# Patient Record
Sex: Male | Born: 1990 | Race: White | Hispanic: No | Marital: Single | Smoking: Current every day smoker
Health system: Southern US, Community
[De-identification: ages and names within clinical notes are randomized; demographics above are authoritative.]

---
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
## 2010-07-06 ENCOUNTER — Encounter: Payer: Self-pay | Admitting: Family Medicine

## 2010-10-31 NOTE — Procedures (Signed)
HISTORY:  This is a 20 year old patient who is being evaluated for episodes  of syncope.  Patient is being evaluated for possible seizures.  This is a  routine EEG.  No skull defects are noted.   EEG CLASSIFICATION:  Normal awake.   DESCRIPTION OF RECORDING:  The background rhythm of this recording consists  of a fairly well modulated medium amplitude alpha rhythm of 10 Hz that is  reactive to eye opening and closure.  As the record progresses, patient  appears to remain in awakened state throughout the recording.  Photic  stimulation is performed and results in a bilateral and symmetric photic  driving response.  Hyperventilation is also performed resulting in a fair  buildup of background rhythm activity without significant slowing seen.  At  no times during the recording did there appear to be evidence of spike,  spike wave discharges or evidence of focal slowing.  EKG monitor shows no  evidence of cardiac rhythm abnormalities with a heart rate of 102.   IMPRESSION:  This is a normal EEG recording in awakened state.  No evidence  of ictal or interictal discharges were seen.      ZOX:WRUE  D:  08/04/2004 15:24:55  T:  08/04/2004 22:02:02  Job #:  [id number]

## 2011-04-21 ENCOUNTER — Emergency Department (INDEPENDENT_AMBULATORY_CARE_PROVIDER_SITE_OTHER): Payer: Self-pay

## 2011-04-21 ENCOUNTER — Emergency Department (HOSPITAL_BASED_OUTPATIENT_CLINIC_OR_DEPARTMENT_OTHER)
Admission: EM | Admit: 2011-04-21 | Discharge: 2011-04-21 | Disposition: A | Discharge status: Home / Self Care | Payer: Self-pay | Attending: Emergency Medicine | Admitting: Emergency Medicine

## 2011-04-21 ENCOUNTER — Other Ambulatory Visit: Payer: Self-pay

## 2011-04-21 DIAGNOSIS — K5732 Diverticulitis of large intestine without perforation or abscess without bleeding: Secondary | ICD-10-CM

## 2011-04-21 DIAGNOSIS — R1012 Left upper quadrant pain: Secondary | ICD-10-CM

## 2011-04-21 DIAGNOSIS — K5792 Diverticulitis of intestine, part unspecified, without perforation or abscess without bleeding: Secondary | ICD-10-CM

## 2011-04-21 DIAGNOSIS — R1033 Periumbilical pain: Secondary | ICD-10-CM | POA: Insufficient documentation

## 2011-04-21 DIAGNOSIS — R000 Tachycardia, unspecified: Secondary | ICD-10-CM

## 2011-04-21 DIAGNOSIS — R109 Unspecified abdominal pain: Secondary | ICD-10-CM

## 2011-04-21 DIAGNOSIS — J45909 Unspecified asthma, uncomplicated: Secondary | ICD-10-CM | POA: Insufficient documentation

## 2011-04-21 LAB — URINALYSIS, ROUTINE W REFLEX MICROSCOPIC
Bilirubin Urine: NEGATIVE
Glucose, UA: NEGATIVE mg/dL
Hgb urine dipstick: NEGATIVE
Ketones, ur: NEGATIVE mg/dL
Leukocytes, UA: NEGATIVE
Nitrite: NEGATIVE
Protein, ur: NEGATIVE mg/dL
Specific Gravity, Urine: 1.021 (ref 1.005–1.030)
Urobilinogen, UA: 0.2 mg/dL (ref 0.0–1.0)
pH: 6 (ref 5.0–8.0)

## 2011-04-21 LAB — COMPREHENSIVE METABOLIC PANEL
ALT: 18 U/L (ref 0–53)
AST: 16 U/L (ref 0–37)
Albumin: 4.3 g/dL (ref 3.5–5.2)
Alkaline Phosphatase: 71 U/L (ref 39–117)
Calcium: 9.6 mg/dL (ref 8.4–10.5)
GFR calc Af Amer: >90 mL/min (ref >90)
Glucose, Bld: 104 mg/dL — ABNORMAL HIGH (ref 70–99)
Potassium: 3.8 mEq/L (ref 3.5–5.1)
Sodium: 140 mEq/L (ref 135–145)
Total Protein: 7.8 g/dL (ref 6.0–8.3)

## 2011-04-21 LAB — CBC
HCT: 40 % (ref 39.0–52.0)
Hemoglobin: 14.1 g/dL (ref 13.0–17.0)
MCH: 31.7 pg (ref 26.0–34.0)
MCHC: 35.3 g/dL (ref 30.0–36.0)
MCV: 89.9 fL (ref 78.0–100.0)
RBC: 4.45 MIL/uL (ref 4.22–5.81)

## 2011-04-21 MED ORDER — METRONIDAZOLE 500 MG PO TABS: 500.0000 mg | ORAL_TABLET | Freq: Four times a day (QID) | ORAL | Status: AC

## 2011-04-21 MED ORDER — CIPROFLOXACIN HCL 750 MG PO TABS: 750.0000 mg | ORAL_TABLET | Freq: Two times a day (BID) | ORAL | Status: AC

## 2011-04-21 MED ORDER — ONDANSETRON HCL 4 MG/2ML IJ SOLN
4.0000 mg | Freq: Once | INTRAMUSCULAR | Status: AC
  Administered 2011-04-21: 4 mg via INTRAVENOUS
  Filled 2011-04-21: qty 2

## 2011-04-21 MED ORDER — SODIUM CHLORIDE 0.9 % IV BOLUS (SEPSIS)
1000.0000 mL | Freq: Once | INTRAVENOUS | Status: AC
  Administered 2011-04-21: 1000 mL via INTRAVENOUS

## 2011-04-21 MED ORDER — MORPHINE SULFATE 4 MG/ML IJ SOLN
4.0000 mg | Freq: Once | INTRAMUSCULAR | Status: AC
  Administered 2011-04-21: 4 mg via INTRAVENOUS
  Filled 2011-04-21: qty 1

## 2011-04-21 MED ORDER — HYDROCODONE-ACETAMINOPHEN 5-500 MG PO TABS: 1.0000 | ORAL_TABLET | Freq: Four times a day (QID) | ORAL | Status: AC | PRN

## 2011-04-21 MED ORDER — METRONIDAZOLE 500 MG PO TABS
500.0000 mg | ORAL_TABLET | Freq: Once | ORAL | Status: AC
  Administered 2011-04-21: 500 mg via ORAL
  Filled 2011-04-21: qty 1

## 2011-04-21 MED ORDER — CIPROFLOXACIN HCL 500 MG PO TABS
500.0000 mg | ORAL_TABLET | Freq: Once | ORAL | Status: AC
  Administered 2011-04-21: 500 mg via ORAL
  Filled 2011-04-21: qty 1

## 2011-04-21 NOTE — ED Provider Notes (Signed)
Date: 04/21/2011  Rate: 119  Rhythm: sinus tachycardia  QRS Axis: normal  Intervals: normal QRS:  Left atrial hypertrophy  ST/T Wave abnormalities: normal  Conduction Disutrbances:none  Narrative Interpretation:   Old EKG Reviewed: none available    Carleene Cooper III, MD 04/21/11 1513

## 2011-04-21 NOTE — ED Notes (Signed)
Pt c/o of abdominal pain that started yesterday.

## 2011-04-21 NOTE — ED Notes (Signed)
Pt states that he knows he has hx of fast HR due to he was denied to give blood x 3 in the past with reason that HR was elevated-pt also states he took one hydrocodone of his sister's PTA

## 2011-04-21 NOTE — ED Provider Notes (Signed)
History     CSN: [id number] Arrival date & time: 04/21/2011  1:50 PM   First MD Initiated Contact with Patient 04/21/11 1342      Chief Complaint  Patient presents with  . Abdominal Pain    (Consider location/radiation/quality/duration/timing/severity/associated sxs/prior treatment) HPI Comments: Pt states that he started with abdominal pain that was periumbilical yesterday:pt states that he is having luq tenderness  Patient is a 20 y.o. male presenting with abdominal pain. The history is provided by the patient. No language interpreter was used.  Abdominal Pain The primary symptoms of the illness include abdominal pain. The primary symptoms of the illness do not include fever, nausea, vomiting, diarrhea or dysuria. The current episode started yesterday. The onset of the illness was sudden. The problem has not changed since onset. Symptoms associated with the illness do not include constipation, urgency or back pain.    Past Medical History  Diagnosis Date  . Asthma     History reviewed. No pertinent past surgical history.  No family history on file.  History  Substance Use Topics  . Smoking status: Current Some Day Smoker  . Smokeless tobacco: Not on file  . Alcohol Use: No      Review of Systems  Constitutional: Negative for fever.  Gastrointestinal: Positive for abdominal pain. Negative for nausea, vomiting, diarrhea and constipation.  Genitourinary: Negative for dysuria and urgency.  Musculoskeletal: Negative for back pain.  All other systems reviewed and are negative.    Allergies  Penicillin g benzathine  Home Medications  No current outpatient prescriptions on file.  BP 146/84  Pulse 123  Temp(Src) 98.4 F (36.9 C) (Oral)  Resp 16  Ht 5\' 7"  (1.702 m)  Wt 140 lb (63.504 kg)  BMI 21.93 kg/m2  SpO2 100%  Physical Exam  Nursing note and vitals reviewed. Constitutional: He is oriented to person, place, and time. He appears well-developed and  well-nourished.  HENT:  Head: Normocephalic and atraumatic.  Eyes: EOM are normal.  Neck: Normal range of motion.  Cardiovascular: Normal rate and regular rhythm.   Pulmonary/Chest: Breath sounds normal.  Abdominal: Soft. Bowel sounds are normal.  Musculoskeletal: Normal range of motion.  Neurological: He is alert and oriented to person, place, and time.  Skin: Skin is warm and dry.  Psychiatric: He has a normal mood and affect.    ED Course  Procedures (including critical care time)  Labs Reviewed  COMPREHENSIVE METABOLIC PANEL - Abnormal; Notable for the following:    Glucose, Bld 104 (*)    All other components within normal limits  CBC - Abnormal; Notable for the following:    WBC 14.0 (*)    All other components within normal limits  URINALYSIS, ROUTINE W REFLEX MICROSCOPIC  LIPASE, BLOOD  D-DIMER, QUANTITATIVE   Ct Abdomen Pelvis Wo Contrast  04/21/2011  *RADIOLOGY REPORT*  Clinical Data: Left flank and lower quadrant pain.  CT ABDOMEN AND PELVIS WITHOUT CONTRAST  Technique:  Multidetector CT imaging of the abdomen and pelvis was performed following the standard protocol without intravenous contrast.  Comparison: None.  Findings: No evidence of renal calculi or hydronephrosis.  No evidence of ureteral calculi or dilatation.  Colonic diverticulosis is seen. Colonic wall thickening and pericolonic inflammatory changes are seen in the area of diverticulosis in the distal transverse colon, consistent with mild to moderate diverticulitis.  No evidence of abscess or extraluminal gas.  No evidence of dilated bowel loops.  No other inflammatory process or abnormal fluid collections identified.  The abdominal parenchymal organs are unremarkable in appearance on this noncontrast study.  Gallbladder is unremarkable. No soft tissue masses or lymphadenopathy identified.  IMPRESSION:  1.  Mild to moderate diverticulitis involving the distal transverse colon. 2.  No evidence of abscess or other  complication.  Original Report Authenticated By: Danae Orleans, M.D.   Dg Chest 2 View  04/21/2011  *RADIOLOGY REPORT*  Clinical Data: Left upper abdominal pain, tachycardia  CHEST - 2 VIEW  Comparison: None.  Findings: Lungs clear.  Heart size and pulmonary vascularity normal.  No effusion.  Visualized bones unremarkable.  IMPRESSION: No acute disease  Original Report Authenticated By: Osa Craver, M.D.     1. Diverticulitis       MDM  Pt tolerating po here in the er:no sign of abscess noted:will send home on antibiotics:discussed with pt and family things that would warrant return:pt states that he has a history of tachycardia, don't think that it is related to process going on today   Medical screening examination/treatment/procedure(s) were performed by non-physician practitioner and as supervising physician I was immediately available for consultation/collaboration. Osvaldo Human, M.D.      Teressa Lower, NP 04/21/11 1723  Carleene Cooper III, MD 04/22/11 2100

## 2012-06-28 IMAGING — CT CT ABD-PELV W/O CM
2 of 4 series · 16 of 46 positions shown, 18 images · non-contrast
Comparison: None.

CLINICAL DATA: Left flank and lower quadrant pain.

CT ABDOMEN AND PELVIS WITHOUT CONTRAST
TECHNIQUE: Multidetector CT imaging of the abdomen and pelvis was
performed following the standard protocol without intravenous
contrast.

[Series 2: renal stone < 200 lbs 5.0 b31f · axial · 0.65mm/px · z∈[+764,+1188]mm · 13 of 95 slices shown, 15 images]
[im 5/95  soft-tissue]
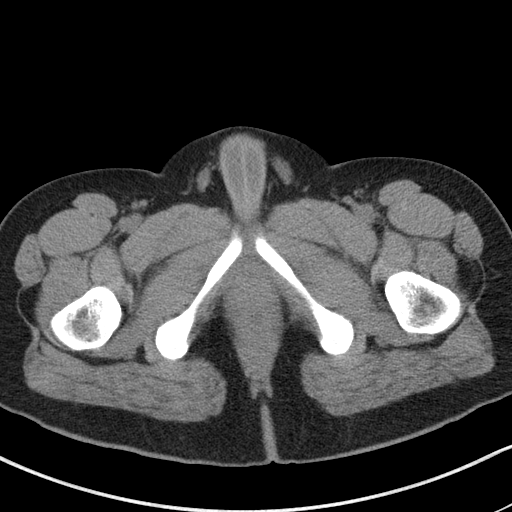
[im 5/95  bone]
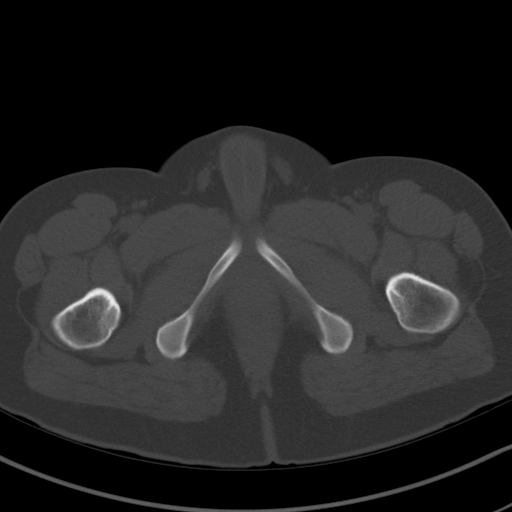
[im 13/95  soft-tissue]
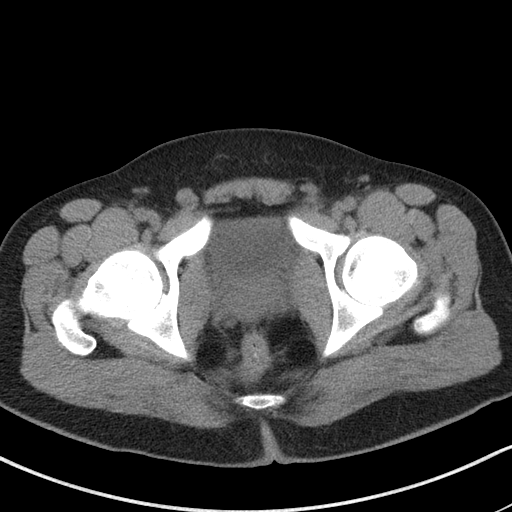
[im 21/95  soft-tissue]
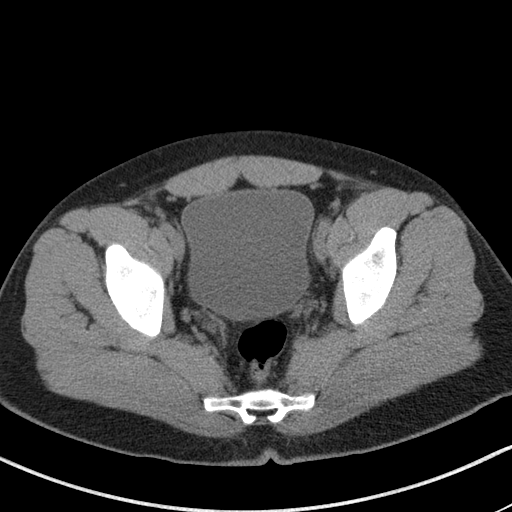
[im 25/95  soft-tissue]
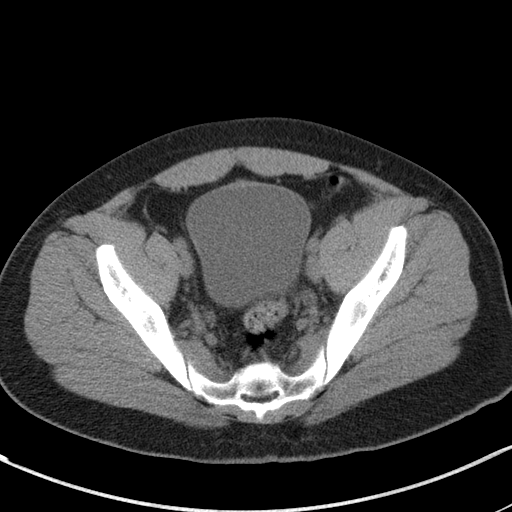
[im 33/95  soft-tissue]
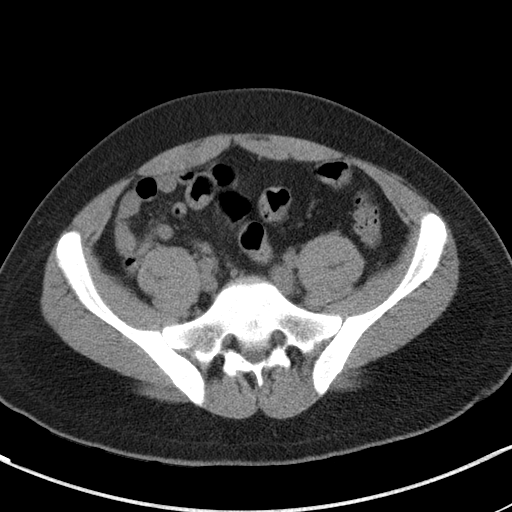
[im 41/95  soft-tissue]
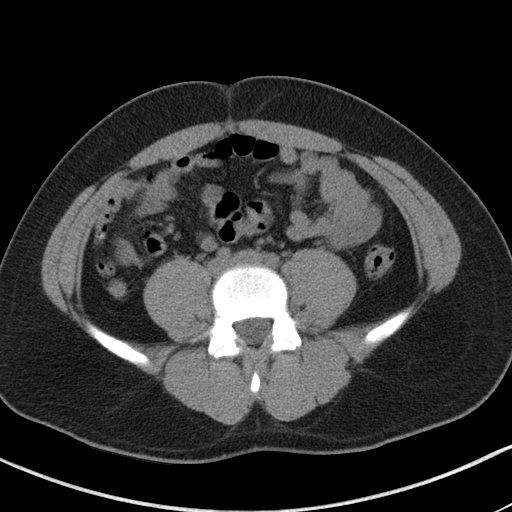
[im 50/95  soft-tissue]
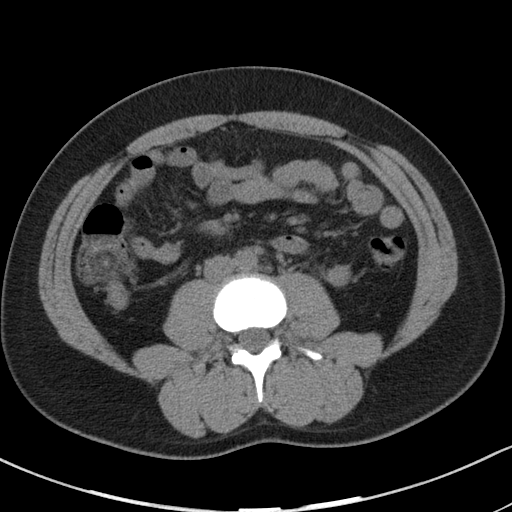
[im 54/95  soft-tissue]
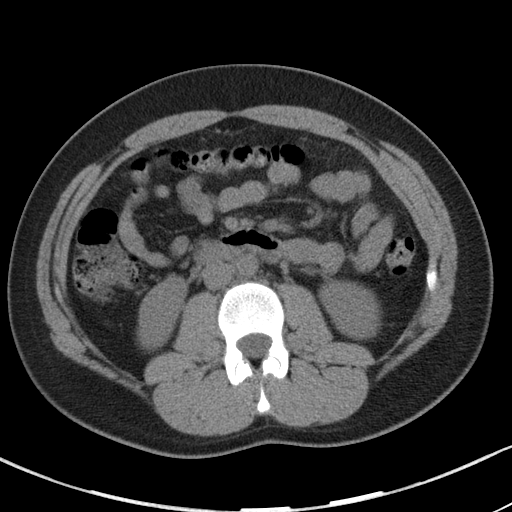
[im 62/95  soft-tissue]
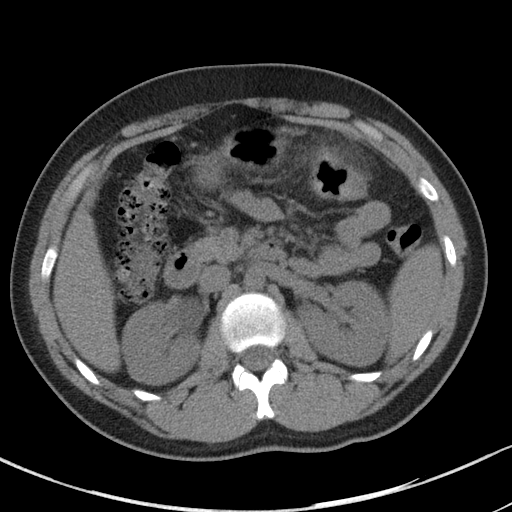
[im 62/95  bone]
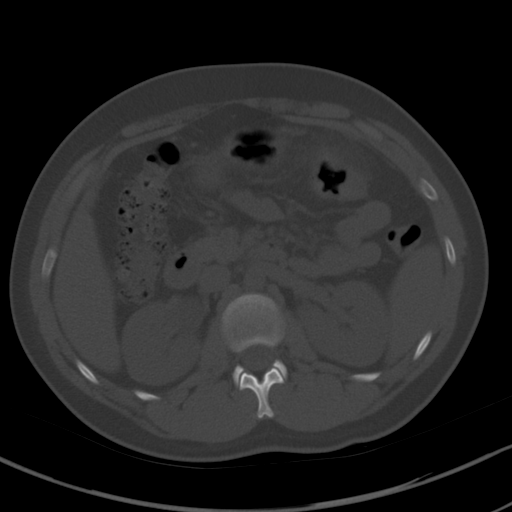
[im 70/95  soft-tissue]
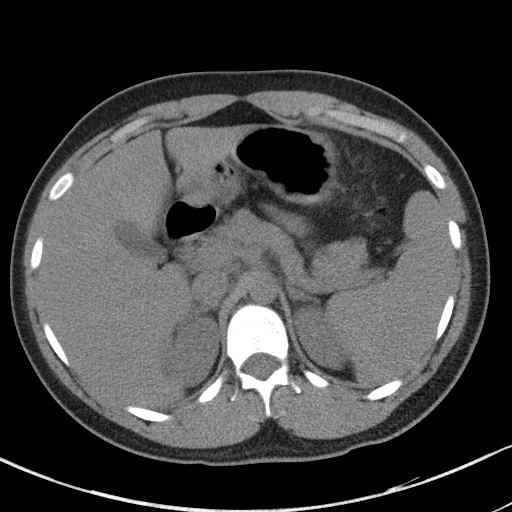
[im 74/95  soft-tissue]
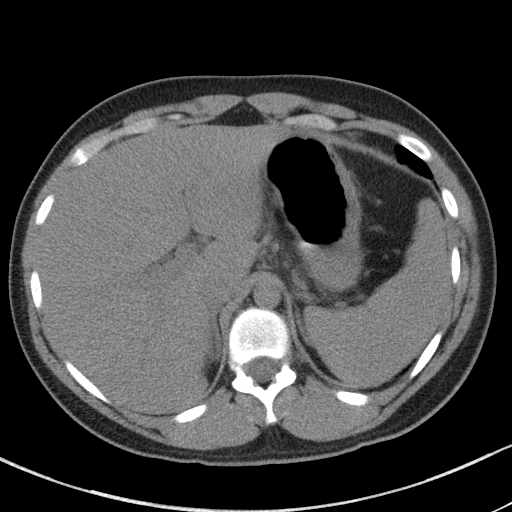
[im 82/95  soft-tissue]
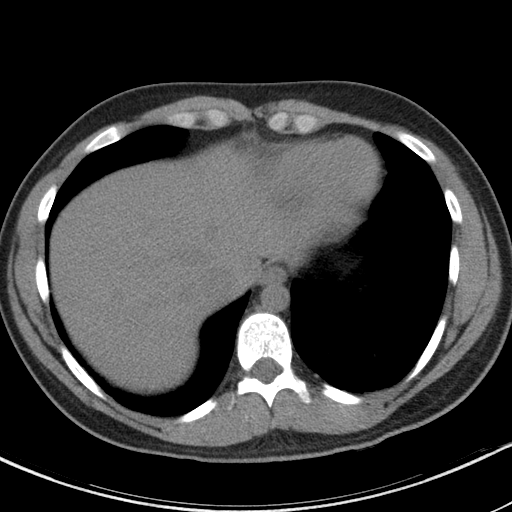
[im 90/95  soft-tissue]
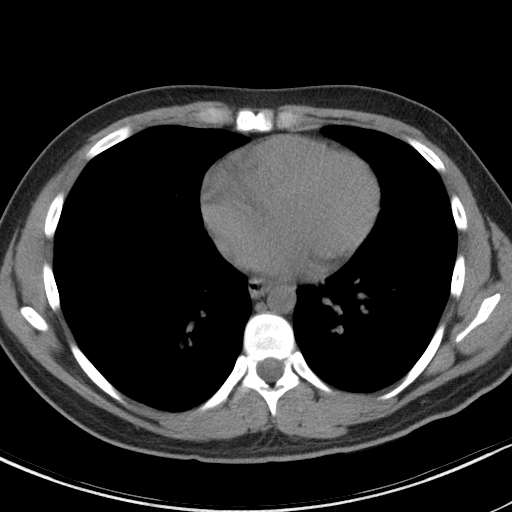

[Series 5: renal stone 3.0 coronal · coronal · 0.64mm/px · 3 of 86 slices shown]
[im 29/86  soft-tissue]
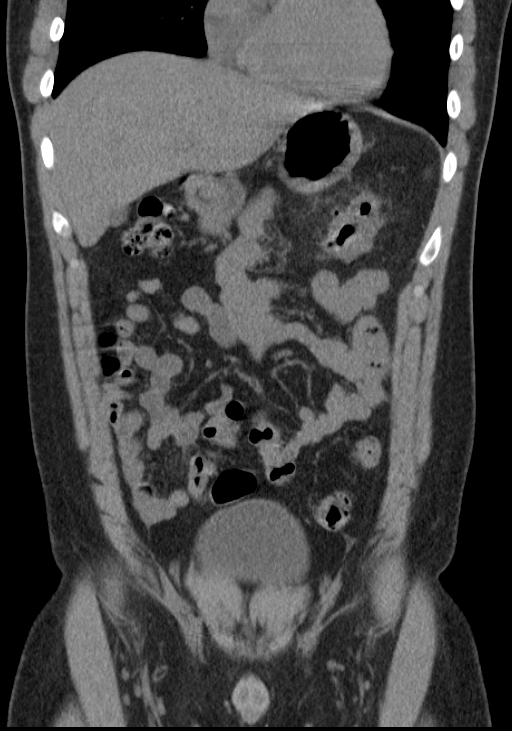
[im 38/86  soft-tissue]
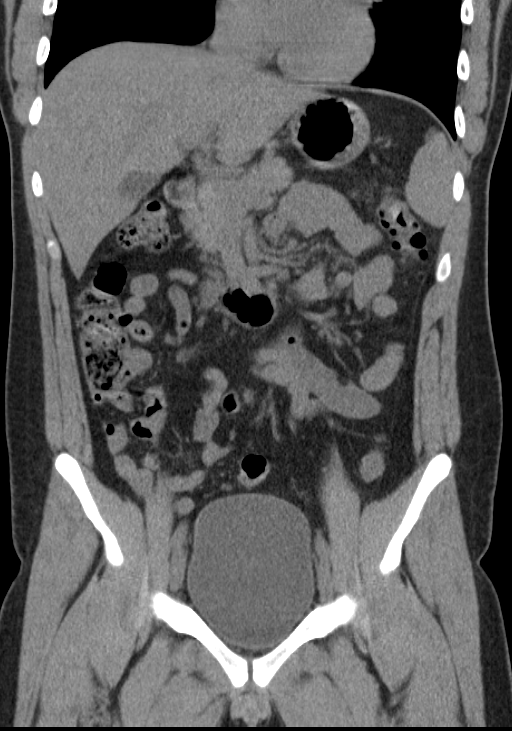
[im 48/86  soft-tissue]
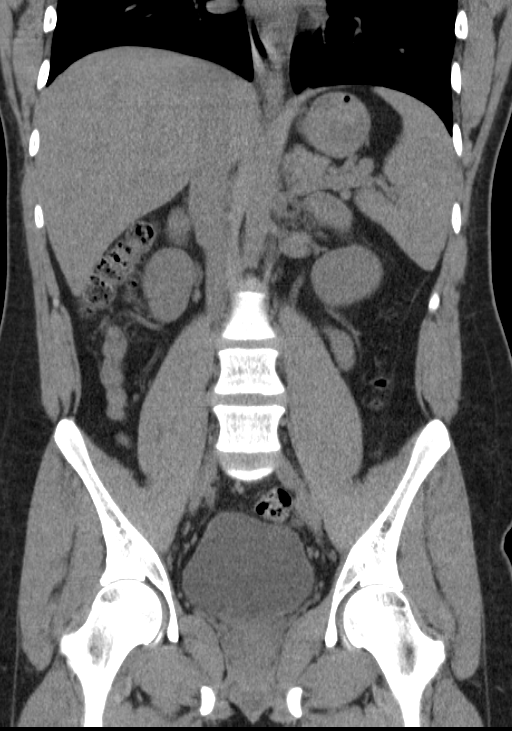

[16 of 46 positions shown; findings below may reference images not displayed]

FINDINGS: No evidence of renal calculi or hydronephrosis.  No
evidence of ureteral calculi or dilatation.

Colonic diverticulosis is seen. Colonic wall thickening and
pericolonic inflammatory changes are seen in the area of
diverticulosis in the distal transverse colon, consistent with mild
to moderate diverticulitis.  No evidence of abscess or extraluminal
gas.  No evidence of dilated bowel loops.

No other inflammatory process or abnormal fluid collections
identified.  The abdominal parenchymal organs are unremarkable in
appearance on this noncontrast study.  Gallbladder is unremarkable.
No soft tissue masses or lymphadenopathy identified.
IMPRESSION: 1.  Mild to moderate diverticulitis involving the distal transverse
colon.
2.  No evidence of abscess or other complication.

## 2020-09-01 ENCOUNTER — Emergency Department (HOSPITAL_COMMUNITY)
Admission: EM | Admit: 2020-09-01 | Discharge: 2020-09-01 | Disposition: A | Discharge status: Home / Self Care | Payer: BC Managed Care – PPO | Attending: Emergency Medicine | Admitting: Emergency Medicine

## 2020-09-01 ENCOUNTER — Encounter (HOSPITAL_COMMUNITY): Payer: Self-pay

## 2020-09-01 ENCOUNTER — Emergency Department (HOSPITAL_COMMUNITY): Payer: BC Managed Care – PPO

## 2020-09-01 ENCOUNTER — Other Ambulatory Visit: Payer: Self-pay

## 2020-09-01 DIAGNOSIS — F419 Anxiety disorder, unspecified: Secondary | ICD-10-CM | POA: Diagnosis not present

## 2020-09-01 DIAGNOSIS — R002 Palpitations: Secondary | ICD-10-CM | POA: Diagnosis present

## 2020-09-01 DIAGNOSIS — J45909 Unspecified asthma, uncomplicated: Secondary | ICD-10-CM | POA: Insufficient documentation

## 2020-09-01 DIAGNOSIS — F17200 Nicotine dependence, unspecified, uncomplicated: Secondary | ICD-10-CM | POA: Diagnosis not present

## 2020-09-01 DIAGNOSIS — F10129 Alcohol abuse with intoxication, unspecified: Secondary | ICD-10-CM | POA: Diagnosis not present

## 2020-09-01 DIAGNOSIS — F10920 Alcohol use, unspecified with intoxication, uncomplicated: Secondary | ICD-10-CM

## 2020-09-01 LAB — COMPREHENSIVE METABOLIC PANEL
ALT: 24 U/L (ref 0–44)
AST: 20 U/L (ref 15–41)
Albumin: 4.7 g/dL (ref 3.5–5.0)
Alkaline Phosphatase: 47 U/L (ref 38–126)
Anion gap: 11 (ref 5–15)
BUN: 12 mg/dL (ref 6–20)
CO2: 23 mmol/L (ref 22–32)
Calcium: 9.1 mg/dL (ref 8.9–10.3)
Chloride: 111 mmol/L (ref 98–111)
Creatinine, Ser: 0.55 mg/dL — ABNORMAL LOW (ref 0.61–1.24)
GFR, Estimated: >60 mL/min (ref >60)
Glucose, Bld: 116 mg/dL — ABNORMAL HIGH (ref 70–99)
Potassium: 3.7 mmol/L (ref 3.5–5.1)
Sodium: 145 mmol/L (ref 135–145)
Total Bilirubin: 0.2 mg/dL — ABNORMAL LOW (ref 0.3–1.2)
Total Protein: 8 g/dL (ref 6.5–8.1)

## 2020-09-01 LAB — RAPID URINE DRUG SCREEN, HOSP PERFORMED
Amphetamines: NOT DETECTED
Barbiturates: NOT DETECTED
Benzodiazepines: NOT DETECTED
Cocaine: POSITIVE — AB
Opiates: NOT DETECTED
Tetrahydrocannabinol: POSITIVE — AB

## 2020-09-01 LAB — CBC WITH DIFFERENTIAL/PLATELET
Abs Immature Granulocytes: 0.03 10*3/uL (ref 0.00–0.07)
Basophils Absolute: 0 10*3/uL (ref 0.0–0.1)
Basophils Relative: 0 %
Eosinophils Absolute: 0.2 10*3/uL (ref 0.0–0.5)
Eosinophils Relative: 3 %
HCT: 44.9 % (ref 39.0–52.0)
Hemoglobin: 15.8 g/dL (ref 13.0–17.0)
Immature Granulocytes: 0 %
Lymphocytes Relative: 30 %
Lymphs Abs: 2.1 10*3/uL (ref 0.7–4.0)
MCH: 33.9 pg (ref 26.0–34.0)
MCHC: 35.2 g/dL (ref 30.0–36.0)
MCV: 96.4 fL (ref 80.0–100.0)
Monocytes Absolute: 0.5 10*3/uL (ref 0.1–1.0)
Monocytes Relative: 7 %
Neutro Abs: 4.4 10*3/uL (ref 1.7–7.7)
Neutrophils Relative %: 60 %
Platelets: 215 10*3/uL (ref 150–400)
RBC: 4.66 MIL/uL (ref 4.22–5.81)
RDW: 12.7 % (ref 11.5–15.5)
WBC: 7.3 10*3/uL (ref 4.0–10.5)
nRBC: 0 % (ref 0.0–0.2)

## 2020-09-01 LAB — TROPONIN I (HIGH SENSITIVITY): Troponin I (High Sensitivity): 4 ng/L (ref <18)

## 2020-09-01 LAB — TSH: TSH: 2.331 u[IU]/mL (ref 0.350–4.500)

## 2020-09-01 LAB — ETHANOL: Alcohol, Ethyl (B): 286 mg/dL — ABNORMAL HIGH (ref <10)

## 2020-09-01 MED ORDER — HYDROXYZINE HCL 25 MG PO TABS: 25.0000 mg | ORAL_TABLET | Freq: Three times a day (TID) | ORAL | 0 refills | Status: AC | PRN

## 2020-09-01 MED ORDER — ALBUTEROL SULFATE HFA 108 (90 BASE) MCG/ACT IN AERS
2.0000 | INHALATION_SPRAY | RESPIRATORY_TRACT | Status: DC | PRN
  Administered 2020-09-01: 2 via RESPIRATORY_TRACT
  Filled 2020-09-01: qty 6.7

## 2020-09-01 MED ORDER — AEROCHAMBER Z-STAT PLUS/MEDIUM MISC
1.0000 | Freq: Once | Status: AC
  Administered 2020-09-01: 1
  Filled 2020-09-01: qty 1

## 2020-09-01 NOTE — ED Provider Notes (Signed)
[facility] Provider Note   CSN: [id number] Arrival date & time: 09/01/20  0701     History No chief complaint on file.   Bryan Cameron is a 30 y.o. male.  30 year old male with past medical history including asthma who presents with palpitations and chest pain.  EMS reports the patient was drinking alcohol and smoking a cigarette and had palpitations so he called EMS.  He told them that has been getting worse over the past several years.  He tells me that it has been going on for several months and he has palpitations at least once a day.  He has occasional episodes of chest pressure that often happen when the palpitations occur but sometimes outside of the palpitations episodes.  Patient told me that he does not drink alcohol daily but admitted to EMS that he has 3-15 shots of liquor every day.  He denies other drug use.  No fevers or vomiting.  No history of heart disease in parents or siblings but history of heart problems in grandparents.  The history is provided by the patient and the EMS personnel.       Past Medical History:  Diagnosis Date  . Asthma     Patient Active Problem List   Diagnosis Date Noted  . ACUTE BRONCHITIS 02/22/2008  . ACUTE PHARYNGITIS 11/28/2007    History reviewed. No pertinent surgical history.     History reviewed. No pertinent family history.  Social History   Tobacco Use  . Smoking status: Current Some Day Smoker  Substance Use Topics  . Alcohol use: No  . Drug use: No    Home Medications Prior to Admission medications   Medication Sig Start Date End Date Taking? Authorizing Provider  hydrOXYzine (ATARAX/VISTARIL) 25 MG tablet Take 1 tablet (25 mg total) by mouth every 8 (eight) hours as needed for anxiety. 09/01/20  Yes Gavyn Zoss, Ambrose Finland, MD    Allergies    Penicillin g benzathine  Review of Systems   Review of Systems All other systems reviewed and are negative except that which was  mentioned in HPI  Physical Exam Updated Vital Signs BP 126/84   Pulse (!) 114   Temp 98.2 F (36.8 C) (Oral)   Resp 19   Ht 5\' 7"  (1.702 m)   Wt 63.5 kg   SpO2 96%   BMI 21.93 kg/m   Physical Exam Vitals and nursing note reviewed.  Constitutional:      General: He is not in acute distress.    Appearance: Normal appearance.  HENT:     Head: Normocephalic and atraumatic.  Eyes:     Comments: B/l conjunctival injection, dilated pupils  Cardiovascular:     Rate and Rhythm: Regular rhythm. Tachycardia present.     Heart sounds: Normal heart sounds. No murmur heard.   Pulmonary:     Effort: Pulmonary effort is normal.     Breath sounds: Normal breath sounds.  Abdominal:     General: Abdomen is flat. Bowel sounds are normal. There is no distension.     Palpations: Abdomen is soft.     Tenderness: There is no abdominal tenderness.  Musculoskeletal:     Right lower leg: No edema.     Left lower leg: No edema.  Skin:    General: Skin is warm and dry.  Neurological:     Mental Status: He is alert and oriented to person, place, and time.     Comments: fluent  Psychiatric:  Mood and Affect: Affect is flat.        Speech: Speech is delayed.        Behavior: Behavior is cooperative.        Cognition and Memory: Cognition is impaired.     ED Results / Procedures / Treatments   Labs (all labs ordered are listed, but only abnormal results are displayed) Labs Reviewed  COMPREHENSIVE METABOLIC PANEL - Abnormal; Notable for the following components:      Result Value   Glucose, Bld 116 (*)    Creatinine, Ser 0.55 (*)    Total Bilirubin 0.2 (*)    All other components within normal limits  RAPID URINE DRUG SCREEN, HOSP PERFORMED - Abnormal; Notable for the following components:   Cocaine POSITIVE (*)    Tetrahydrocannabinol POSITIVE (*)    All other components within normal limits  ETHANOL - Abnormal; Notable for the following components:   Alcohol, Ethyl (B) 286  (*)    All other components within normal limits  CBC WITH DIFFERENTIAL/PLATELET  TSH  TROPONIN I (HIGH SENSITIVITY)    EKG EKG Interpretation  Date/Time:  Sunday September 01 2020 07:28:25 EDT Ventricular Rate:  113 PR Interval:  144 QRS Duration: 84 QT Interval:  316 QTC Calculation: 433 R Axis:   -21 Text Interpretation: Sinus tachycardia Otherwise normal ECG No significant change since last tracing Confirmed by Frederick Peers [phone]) on 09/01/2020 8:04:22 AM   Radiology DG Chest 2 View  Result Date: 09/01/2020 CLINICAL DATA:  30 year old male with palpitations, left side chest pain and shortness of breath. EXAM: CHEST - 2 VIEW COMPARISON:  Chest radiographs 04/21/2011. FINDINGS: Mildly lower lung volumes. Mediastinal contours remain normal. Visualized tracheal air column is within normal limits. Both lungs remain clear. No pneumothorax or pleural effusion. Negative visible bowel gas and osseous structures. IMPRESSION: Negative.  No cardiopulmonary abnormality. Electronically Signed   By: Odessa Fleming M.D.   On: 09/01/2020 08:10    Procedures Procedures   Medications Ordered in ED Medications  albuterol (VENTOLIN HFA) 108 (90 Base) MCG/ACT inhaler 2 puff (2 puffs Inhalation Given 09/01/20 0844)  aerochamber Z-Stat Plus/medium 1 each (1 each Other Given 09/01/20 8786)    ED Course  I have reviewed the triage vital signs and the nursing notes.  Pertinent labs & imaging results that were available during my care of the patient were reviewed by me and considered in my medical decision making (see chart for details).    MDM Rules/Calculators/A&P                          PT mildly tachycardic on exam, appears mildly intoxicated. EKG without ischemic changes.  BAL 286, UDS positive for cocaine and THC. CBC And CMP reassuring, trop negative, TSH normal.  Patient's main complaint on reassessment is feeling anxious. Sx not suggestive of ACS.  I counseled patient on alcohol and drug cessation  and encouraged to follow-up with PCP regarding his symptoms.  Provided with Atarax to use as needed at home but explained that this is only a temporary solution, but PCP can discuss long-term anxiety management.  Patient requested refill on albuterol inhaler. Final Clinical Impression(s) / ED Diagnoses Final diagnoses:  Palpitations  Anxiety  Alcoholic intoxication without complication (HCC)    Rx / DC Orders ED Discharge Orders         Ordered    hydrOXYzine (ATARAX/VISTARIL) 25 MG tablet  Every 8 hours PRN  09/01/20 0908           Ariany Kesselman, Ambrose Finland, MD 09/01/20 [phone]

## 2020-09-01 NOTE — ED Triage Notes (Signed)
Patient BIB [facility] from home. Patient was drinking and smoking a cigarette while he was having palpitations, so he called EMS. Patient said its been getting worse over the past few years. He has history of anxiety, but does not take meds for it. 3-15 shots of Liquor every day.

## 2023-04-01 ENCOUNTER — Other Ambulatory Visit (HOSPITAL_BASED_OUTPATIENT_CLINIC_OR_DEPARTMENT_OTHER): Payer: Self-pay

## 2023-04-01 MED ORDER — LISDEXAMFETAMINE DIMESYLATE 30 MG PO CAPS
30.0000 mg | ORAL_CAPSULE | Freq: Every morning | ORAL | 0 refills | Status: AC
  Filled 2023-04-01: qty 30, 30d supply, fill #0

## 2023-04-28 ENCOUNTER — Other Ambulatory Visit (HOSPITAL_BASED_OUTPATIENT_CLINIC_OR_DEPARTMENT_OTHER): Payer: Self-pay

## 2023-04-28 ENCOUNTER — Other Ambulatory Visit: Payer: Self-pay

## 2023-04-28 MED ORDER — LISDEXAMFETAMINE DIMESYLATE 40 MG PO CAPS
40.0000 mg | ORAL_CAPSULE | Freq: Every morning | ORAL | 0 refills | Status: AC
  Filled 2023-04-28 – 2023-04-29 (×2): qty 30, 30d supply, fill #0

## 2023-04-29 ENCOUNTER — Other Ambulatory Visit (HOSPITAL_BASED_OUTPATIENT_CLINIC_OR_DEPARTMENT_OTHER): Payer: Self-pay

## 2023-05-25 ENCOUNTER — Other Ambulatory Visit (HOSPITAL_BASED_OUTPATIENT_CLINIC_OR_DEPARTMENT_OTHER): Payer: Self-pay

## 2023-05-25 MED ORDER — LISDEXAMFETAMINE DIMESYLATE 50 MG PO CAPS
50.0000 mg | ORAL_CAPSULE | Freq: Every morning | ORAL | 0 refills | Status: AC
  Filled 2023-05-25 (×2): qty 30, 30d supply, fill #0

## 2023-06-28 ENCOUNTER — Other Ambulatory Visit (HOSPITAL_BASED_OUTPATIENT_CLINIC_OR_DEPARTMENT_OTHER): Payer: Self-pay

## 2023-06-29 ENCOUNTER — Other Ambulatory Visit (HOSPITAL_BASED_OUTPATIENT_CLINIC_OR_DEPARTMENT_OTHER): Payer: Self-pay

## 2023-06-30 ENCOUNTER — Other Ambulatory Visit: Payer: Self-pay

## 2023-06-30 ENCOUNTER — Other Ambulatory Visit (HOSPITAL_BASED_OUTPATIENT_CLINIC_OR_DEPARTMENT_OTHER): Payer: Self-pay

## 2023-06-30 MED ORDER — AMLODIPINE BESYLATE 10 MG PO TABS
10.0000 mg | ORAL_TABLET | Freq: Every day | ORAL | 0 refills | Status: AC
  Filled 2023-06-30: qty 90, 90d supply, fill #0

## 2023-06-30 MED ORDER — PANTOPRAZOLE SODIUM 20 MG PO TBEC
20.0000 mg | DELAYED_RELEASE_TABLET | Freq: Every morning | ORAL | 1 refills | Status: AC
  Filled 2023-06-30 (×2): qty 90, 90d supply, fill #0

## 2023-06-30 MED ORDER — TEMAZEPAM 15 MG PO CAPS
15.0000 mg | ORAL_CAPSULE | Freq: Every evening | ORAL | 0 refills | Status: AC
  Filled 2023-06-30 (×2): qty 30, 30d supply, fill #0

## 2023-06-30 MED ORDER — LISDEXAMFETAMINE DIMESYLATE 60 MG PO CAPS
60.0000 mg | ORAL_CAPSULE | Freq: Every morning | ORAL | 0 refills | Status: AC
  Filled 2023-06-30 (×2): qty 30, 30d supply, fill #0

## 2023-07-01 ENCOUNTER — Other Ambulatory Visit: Payer: Self-pay

## 2023-07-05 ENCOUNTER — Other Ambulatory Visit (HOSPITAL_BASED_OUTPATIENT_CLINIC_OR_DEPARTMENT_OTHER): Payer: Self-pay

## 2023-08-09 ENCOUNTER — Other Ambulatory Visit (HOSPITAL_BASED_OUTPATIENT_CLINIC_OR_DEPARTMENT_OTHER): Payer: Self-pay

## 2023-08-09 MED ORDER — AMPHETAMINE-DEXTROAMPHETAMINE 20 MG PO TABS
20.0000 mg | ORAL_TABLET | Freq: Two times a day (BID) | ORAL | 0 refills | Status: DC
  Filled 2023-08-09: qty 60, 30d supply, fill #0

## 2023-08-09 MED ORDER — AMLODIPINE BESYLATE 10 MG PO TABS
10.0000 mg | ORAL_TABLET | Freq: Every day | ORAL | 0 refills | Status: AC
  Filled 2023-08-09: qty 90, 90d supply, fill #0

## 2023-08-09 MED ORDER — TEMAZEPAM 15 MG PO CAPS
15.0000 mg | ORAL_CAPSULE | Freq: Every evening | ORAL | 0 refills | Status: AC | PRN
  Filled 2023-08-09: qty 30, 30d supply, fill #0

## 2023-09-06 ENCOUNTER — Other Ambulatory Visit (HOSPITAL_BASED_OUTPATIENT_CLINIC_OR_DEPARTMENT_OTHER): Payer: Self-pay

## 2023-09-06 MED ORDER — AMPHETAMINE-DEXTROAMPHETAMINE 20 MG PO TABS
20.0000 mg | ORAL_TABLET | Freq: Two times a day (BID) | ORAL | 0 refills | Status: DC
  Filled 2023-09-06: qty 60, 30d supply, fill #0

## 2023-10-04 ENCOUNTER — Other Ambulatory Visit (HOSPITAL_BASED_OUTPATIENT_CLINIC_OR_DEPARTMENT_OTHER): Payer: Self-pay

## 2023-10-04 MED ORDER — AMPHETAMINE-DEXTROAMPHETAMINE 20 MG PO TABS
20.0000 mg | ORAL_TABLET | Freq: Two times a day (BID) | ORAL | 0 refills | Status: DC
  Filled 2023-10-04: qty 60, 30d supply, fill #0

## 2023-10-05 ENCOUNTER — Other Ambulatory Visit (HOSPITAL_BASED_OUTPATIENT_CLINIC_OR_DEPARTMENT_OTHER): Payer: Self-pay

## 2023-10-08 ENCOUNTER — Other Ambulatory Visit (HOSPITAL_BASED_OUTPATIENT_CLINIC_OR_DEPARTMENT_OTHER): Payer: Self-pay

## 2023-10-08 MED ORDER — AMLODIPINE BESYLATE 10 MG PO TABS
10.0000 mg | ORAL_TABLET | Freq: Every day | ORAL | 0 refills | Status: AC
  Filled 2023-10-08 – 2023-10-11 (×2): qty 90, 90d supply, fill #0

## 2023-10-11 ENCOUNTER — Other Ambulatory Visit: Payer: Self-pay

## 2023-10-11 ENCOUNTER — Other Ambulatory Visit (HOSPITAL_BASED_OUTPATIENT_CLINIC_OR_DEPARTMENT_OTHER): Payer: Self-pay

## 2023-11-02 ENCOUNTER — Other Ambulatory Visit (HOSPITAL_COMMUNITY): Payer: Self-pay

## 2023-11-02 ENCOUNTER — Other Ambulatory Visit (HOSPITAL_BASED_OUTPATIENT_CLINIC_OR_DEPARTMENT_OTHER): Payer: Self-pay

## 2023-11-02 MED ORDER — AMPHETAMINE-DEXTROAMPHETAMINE 20 MG PO TABS
20.0000 mg | ORAL_TABLET | Freq: Two times a day (BID) | ORAL | 0 refills | Status: DC
  Filled 2023-11-02 (×2): qty 60, 30d supply, fill #0

## 2023-12-06 ENCOUNTER — Other Ambulatory Visit (HOSPITAL_BASED_OUTPATIENT_CLINIC_OR_DEPARTMENT_OTHER): Payer: Self-pay

## 2023-12-06 MED ORDER — AMPHETAMINE-DEXTROAMPHETAMINE 20 MG PO TABS
20.0000 mg | ORAL_TABLET | Freq: Two times a day (BID) | ORAL | 0 refills | Status: AC
  Filled 2023-12-06: qty 60, 30d supply, fill #0

## 2023-12-16 ENCOUNTER — Other Ambulatory Visit (HOSPITAL_BASED_OUTPATIENT_CLINIC_OR_DEPARTMENT_OTHER): Payer: Self-pay

## 2023-12-16 MED ORDER — BUPROPION HCL ER (XL) 300 MG PO TB24
300.0000 mg | ORAL_TABLET | Freq: Every morning | ORAL | 1 refills | Status: AC
  Filled 2023-12-16: qty 30, 30d supply, fill #0

## 2023-12-16 MED ORDER — METHYLPREDNISOLONE 4 MG PO TBPK
ORAL_TABLET | ORAL | 0 refills | Status: DC
  Filled 2023-12-16: qty 21, 6d supply, fill #0

## 2023-12-16 MED ORDER — TEMAZEPAM 15 MG PO CAPS
15.0000 mg | ORAL_CAPSULE | Freq: Every day | ORAL | 0 refills | Status: AC
  Filled 2023-12-16: qty 30, 30d supply, fill #0

## 2023-12-16 MED ORDER — DOXYCYCLINE HYCLATE 100 MG PO CAPS
100.0000 mg | ORAL_CAPSULE | Freq: Two times a day (BID) | ORAL | 0 refills | Status: AC
  Filled 2023-12-16: qty 20, 10d supply, fill #0

## 2024-01-11 ENCOUNTER — Other Ambulatory Visit (HOSPITAL_BASED_OUTPATIENT_CLINIC_OR_DEPARTMENT_OTHER): Payer: Self-pay

## 2024-01-11 ENCOUNTER — Other Ambulatory Visit: Payer: Self-pay

## 2024-01-11 MED ORDER — AMPHETAMINE-DEXTROAMPHETAMINE 30 MG PO TABS
30.0000 mg | ORAL_TABLET | Freq: Two times a day (BID) | ORAL | 0 refills | Status: DC
  Filled 2024-01-11: qty 60, 30d supply, fill #0

## 2024-01-11 MED ORDER — DOXEPIN HCL 6 MG PO TABS
6.0000 mg | ORAL_TABLET | Freq: Every day | ORAL | 0 refills | Status: AC
  Filled 2024-01-11: qty 30, 30d supply, fill #0

## 2024-01-11 MED ORDER — AMLODIPINE BESYLATE 10 MG PO TABS
10.0000 mg | ORAL_TABLET | Freq: Every day | ORAL | 0 refills | Status: DC
  Filled 2024-01-11: qty 90, 90d supply, fill #0

## 2024-01-12 ENCOUNTER — Other Ambulatory Visit: Payer: Self-pay

## 2024-02-01 ENCOUNTER — Other Ambulatory Visit (HOSPITAL_BASED_OUTPATIENT_CLINIC_OR_DEPARTMENT_OTHER): Payer: Self-pay

## 2024-02-01 MED ORDER — CYCLOBENZAPRINE HCL 10 MG PO TABS
10.0000 mg | ORAL_TABLET | Freq: Three times a day (TID) | ORAL | 0 refills | Status: AC
  Filled 2024-02-01: qty 30, 10d supply, fill #0

## 2024-02-01 MED ORDER — DOXYCYCLINE HYCLATE 100 MG PO CAPS
100.0000 mg | ORAL_CAPSULE | Freq: Two times a day (BID) | ORAL | 0 refills | Status: AC
  Filled 2024-02-01: qty 20, 10d supply, fill #0

## 2024-02-01 MED ORDER — METHYLPREDNISOLONE 4 MG PO TBPK
ORAL_TABLET | ORAL | 0 refills | Status: AC
  Filled 2024-02-01: qty 21, 6d supply, fill #0

## 2024-02-15 ENCOUNTER — Other Ambulatory Visit (HOSPITAL_BASED_OUTPATIENT_CLINIC_OR_DEPARTMENT_OTHER): Payer: Self-pay

## 2024-02-15 MED ORDER — AMPHETAMINE-DEXTROAMPHETAMINE 30 MG PO TABS
30.0000 mg | ORAL_TABLET | Freq: Two times a day (BID) | ORAL | 0 refills | Status: DC
  Filled 2024-02-15: qty 60, 30d supply, fill #0

## 2024-03-13 ENCOUNTER — Encounter (HOSPITAL_COMMUNITY): Payer: Self-pay

## 2024-03-13 ENCOUNTER — Inpatient Hospital Stay (HOSPITAL_COMMUNITY): Payer: Self-pay

## 2024-03-13 ENCOUNTER — Emergency Department (HOSPITAL_COMMUNITY): Payer: Self-pay

## 2024-03-13 ENCOUNTER — Other Ambulatory Visit: Payer: Self-pay

## 2024-03-13 ENCOUNTER — Inpatient Hospital Stay (HOSPITAL_COMMUNITY)
Admission: EM | Admit: 2024-03-13 | Discharge: 2024-03-15 | DRG: 082 | Disposition: A | Discharge status: Home / Self Care | Payer: Self-pay | Source: Intra-hospital | Attending: Surgery | Admitting: Surgery

## 2024-03-13 DIAGNOSIS — S0232XA Fracture of orbital floor, left side, initial encounter for closed fracture: Secondary | ICD-10-CM

## 2024-03-13 DIAGNOSIS — S020XXA Fracture of vault of skull, initial encounter for closed fracture: Secondary | ICD-10-CM

## 2024-03-13 DIAGNOSIS — S0240DA Maxillary fracture, left side, initial encounter for closed fracture: Secondary | ICD-10-CM

## 2024-03-13 DIAGNOSIS — S0240FA Zygomatic fracture, left side, initial encounter for closed fracture: Secondary | ICD-10-CM

## 2024-03-13 DIAGNOSIS — S064XAA Epidural hemorrhage with loss of consciousness status unknown, initial encounter: Secondary | ICD-10-CM | POA: Diagnosis present

## 2024-03-13 DIAGNOSIS — S01112A Laceration without foreign body of left eyelid and periocular area, initial encounter: Secondary | ICD-10-CM

## 2024-03-13 DIAGNOSIS — S066XAA Traumatic subarachnoid hemorrhage with loss of consciousness status unknown, initial encounter: Secondary | ICD-10-CM | POA: Diagnosis present

## 2024-03-13 DIAGNOSIS — Z79899 Other long term (current) drug therapy: Secondary | ICD-10-CM

## 2024-03-13 DIAGNOSIS — S0219XA Other fracture of base of skull, initial encounter for closed fracture: Secondary | ICD-10-CM | POA: Diagnosis present

## 2024-03-13 DIAGNOSIS — H9190 Unspecified hearing loss, unspecified ear: Secondary | ICD-10-CM | POA: Diagnosis present

## 2024-03-13 DIAGNOSIS — R1310 Dysphagia, unspecified: Secondary | ICD-10-CM | POA: Diagnosis present

## 2024-03-13 DIAGNOSIS — W2111XA Struck by baseball bat, initial encounter: Secondary | ICD-10-CM

## 2024-03-13 DIAGNOSIS — R040 Epistaxis: Secondary | ICD-10-CM | POA: Diagnosis present

## 2024-03-13 DIAGNOSIS — Y907 Blood alcohol level of 200-239 mg/100 ml: Secondary | ICD-10-CM | POA: Diagnosis present

## 2024-03-13 DIAGNOSIS — M264 Malocclusion, unspecified: Secondary | ICD-10-CM | POA: Diagnosis present

## 2024-03-13 DIAGNOSIS — R40241 Glasgow coma scale score 13-15, unspecified time: Secondary | ICD-10-CM | POA: Diagnosis present

## 2024-03-13 DIAGNOSIS — Z880 Allergy status to penicillin: Secondary | ICD-10-CM

## 2024-03-13 DIAGNOSIS — F1010 Alcohol abuse, uncomplicated: Secondary | ICD-10-CM | POA: Diagnosis present

## 2024-03-13 DIAGNOSIS — S06A0XA Traumatic brain compression without herniation, initial encounter: Secondary | ICD-10-CM | POA: Diagnosis present

## 2024-03-13 DIAGNOSIS — S065XAA Traumatic subdural hemorrhage with loss of consciousness status unknown, initial encounter: Secondary | ICD-10-CM | POA: Diagnosis present

## 2024-03-13 DIAGNOSIS — Z23 Encounter for immunization: Secondary | ICD-10-CM

## 2024-03-13 LAB — URINALYSIS, ROUTINE W REFLEX MICROSCOPIC
Bilirubin Urine: NEGATIVE
Glucose, UA: 50 mg/dL — AB
Hgb urine dipstick: NEGATIVE
Ketones, ur: NEGATIVE mg/dL
Leukocytes,Ua: NEGATIVE
Nitrite: NEGATIVE
Protein, ur: NEGATIVE mg/dL
Specific Gravity, Urine: 1.018 (ref 1.005–1.030)
pH: 5 (ref 5.0–8.0)

## 2024-03-13 LAB — BASIC METABOLIC PANEL WITH GFR
Anion gap: 16 — ABNORMAL HIGH (ref 5–15)
BUN: 10 mg/dL (ref 6–20)
CO2: 21 mmol/L — ABNORMAL LOW (ref 22–32)
Calcium: 8.1 mg/dL — ABNORMAL LOW (ref 8.9–10.3)
Chloride: 103 mmol/L (ref 98–111)
Creatinine, Ser: 0.63 mg/dL (ref 0.61–1.24)
GFR, Estimated: >60 mL/min (ref >60)
Glucose, Bld: 147 mg/dL — ABNORMAL HIGH (ref 70–99)
Potassium: 3.1 mmol/L — ABNORMAL LOW (ref 3.5–5.1)
Sodium: 140 mmol/L (ref 135–145)

## 2024-03-13 LAB — CBC
HCT: 37.8 % — ABNORMAL LOW (ref 39.0–52.0)
HCT: 38.9 % — ABNORMAL LOW (ref 39.0–52.0)
Hemoglobin: 13.2 g/dL (ref 13.0–17.0)
Hemoglobin: 14.1 g/dL (ref 13.0–17.0)
MCH: 33.9 pg (ref 26.0–34.0)
MCH: 34.7 pg — ABNORMAL HIGH (ref 26.0–34.0)
MCHC: 34.9 g/dL (ref 30.0–36.0)
MCHC: 36.2 g/dL — ABNORMAL HIGH (ref 30.0–36.0)
MCV: 97.2 fL (ref 80.0–100.0)
MCV: 95.8 fL (ref 80.0–100.0)
Platelets: 155 K/uL (ref 150–400)
Platelets: 180 K/uL (ref 150–400)
RBC: 3.89 MIL/uL — ABNORMAL LOW (ref 4.22–5.81)
RBC: 4.06 MIL/uL — ABNORMAL LOW (ref 4.22–5.81)
RDW: 12.1 % (ref 11.5–15.5)
RDW: 12.1 % (ref 11.5–15.5)
WBC: 7.1 K/uL (ref 4.0–10.5)
WBC: 12.3 K/uL — ABNORMAL HIGH (ref 4.0–10.5)
nRBC: 0 % (ref 0.0–0.2)
nRBC: 0 % (ref 0.0–0.2)

## 2024-03-13 LAB — SAMPLE TO BLOOD BANK

## 2024-03-13 LAB — COMPREHENSIVE METABOLIC PANEL WITH GFR
ALT: 85 U/L — ABNORMAL HIGH (ref 0–44)
AST: 78 U/L — ABNORMAL HIGH (ref 15–41)
Albumin: 3.3 g/dL — ABNORMAL LOW (ref 3.5–5.0)
Alkaline Phosphatase: 58 U/L (ref 38–126)
Anion gap: 14 (ref 5–15)
BUN: 13 mg/dL (ref 6–20)
CO2: 19 mmol/L — ABNORMAL LOW (ref 22–32)
Calcium: 7.8 mg/dL — ABNORMAL LOW (ref 8.9–10.3)
Chloride: 106 mmol/L (ref 98–111)
Creatinine, Ser: 0.71 mg/dL (ref 0.61–1.24)
GFR, Estimated: >60 mL/min (ref >60)
Glucose, Bld: 160 mg/dL — ABNORMAL HIGH (ref 70–99)
Potassium: 3 mmol/L — ABNORMAL LOW (ref 3.5–5.1)
Sodium: 139 mmol/L (ref 135–145)
Total Bilirubin: 0.4 mg/dL (ref 0.0–1.2)
Total Protein: 6.7 g/dL (ref 6.5–8.1)

## 2024-03-13 LAB — PROTIME-INR
INR: 1 (ref 0.8–1.2)
Prothrombin Time: 13.5 s (ref 11.4–15.2)

## 2024-03-13 LAB — I-STAT CHEM 8, ED
BUN: 12 mg/dL (ref 6–20)
Calcium, Ion: 0.96 mmol/L — ABNORMAL LOW (ref 1.15–1.40)
Chloride: 108 mmol/L (ref 98–111)
Creatinine, Ser: 0.9 mg/dL (ref 0.61–1.24)
Glucose, Bld: 160 mg/dL — ABNORMAL HIGH (ref 70–99)
HCT: 37 % — ABNORMAL LOW (ref 39.0–52.0)
Hemoglobin: 12.6 g/dL — ABNORMAL LOW (ref 13.0–17.0)
Potassium: 3.1 mmol/L — ABNORMAL LOW (ref 3.5–5.1)
Sodium: 142 mmol/L (ref 135–145)
TCO2: 19 mmol/L — ABNORMAL LOW (ref 22–32)

## 2024-03-13 LAB — ETHANOL: Alcohol, Ethyl (B): 211 mg/dL — ABNORMAL HIGH (ref <15)

## 2024-03-13 LAB — I-STAT CG4 LACTIC ACID, ED: Lactic Acid, Venous: 2.5 mmol/L (ref 0.5–1.9)

## 2024-03-13 LAB — HIV ANTIBODY (ROUTINE TESTING W REFLEX): HIV Screen 4th Generation wRfx: NONREACTIVE

## 2024-03-13 MED ORDER — DOCUSATE SODIUM 100 MG PO CAPS
100.0000 mg | ORAL_CAPSULE | Freq: Two times a day (BID) | ORAL | Status: DC
Start: 2024-03-13 — End: 2024-03-15
  Administered 2024-03-13 – 2024-03-15 (×9): 100 mg via ORAL
  Filled 2024-03-13 (×5): qty 1

## 2024-03-13 MED ORDER — METOPROLOL TARTRATE 5 MG/5ML IV SOLN: 5.0000 mg | Freq: Four times a day (QID) | INTRAVENOUS | Status: DC | PRN

## 2024-03-13 MED ORDER — PHENOBARBITAL 32.4 MG PO TABS
64.8000 mg | ORAL_TABLET | Freq: Three times a day (TID) | ORAL | Status: AC
  Administered 2024-03-13 – 2024-03-15 (×11): 64.8 mg via ORAL
  Filled 2024-03-13 (×6): qty 2

## 2024-03-13 MED ORDER — PHENOBARBITAL 32.4 MG PO TABS
32.4000 mg | ORAL_TABLET | Freq: Three times a day (TID) | ORAL | Status: DC
  Administered 2024-03-15: 32.4 mg via ORAL
  Filled 2024-03-13: qty 1

## 2024-03-13 MED ORDER — CEFAZOLIN SODIUM-DEXTROSE 2-4 GM/100ML-% IV SOLN
2.0000 g | Freq: Once | INTRAVENOUS | Status: AC
  Administered 2024-03-13 (×2): 2 g via INTRAVENOUS
  Filled 2024-03-13: qty 100

## 2024-03-13 MED ORDER — POLYETHYLENE GLYCOL 3350 17 G PO PACK: 17.0000 g | PACK | Freq: Every day | ORAL | Status: DC | PRN

## 2024-03-13 MED ORDER — HYDROMORPHONE HCL 1 MG/ML IJ SOLN
0.5000 mg | INTRAMUSCULAR | Status: DC | PRN
  Administered 2024-03-13 – 2024-03-14 (×8): 0.5 mg via INTRAVENOUS
  Filled 2024-03-13 (×4): qty 1

## 2024-03-13 MED ORDER — LEVETIRACETAM 500 MG PO TABS: 500.0000 mg | ORAL_TABLET | Freq: Two times a day (BID) | ORAL | Status: DC

## 2024-03-13 MED ORDER — IOHEXOL 350 MG/ML SOLN
75.0000 mL | Freq: Once | INTRAVENOUS | Status: AC | PRN
  Administered 2024-03-13 (×2): 75 mL via INTRAVENOUS

## 2024-03-13 MED ORDER — METHOCARBAMOL 500 MG PO TABS
500.0000 mg | ORAL_TABLET | Freq: Three times a day (TID) | ORAL | Status: DC
  Administered 2024-03-13 – 2024-03-15 (×13): 500 mg via ORAL
  Filled 2024-03-13 (×7): qty 1

## 2024-03-13 MED ORDER — IOHEXOL 350 MG/ML SOLN: 75.0000 mL | Freq: Once | INTRAVENOUS | Status: DC | PRN

## 2024-03-13 MED ORDER — ONDANSETRON 4 MG PO TBDP: 4.0000 mg | ORAL_TABLET | Freq: Four times a day (QID) | ORAL | Status: DC | PRN

## 2024-03-13 MED ORDER — LACTATED RINGERS IV SOLN: INTRAVENOUS | Status: DC

## 2024-03-13 MED ORDER — ONDANSETRON HCL 4 MG/2ML IJ SOLN: 4.0000 mg | Freq: Four times a day (QID) | INTRAMUSCULAR | Status: DC | PRN

## 2024-03-13 MED ORDER — OXYCODONE HCL 5 MG PO TABS
5.0000 mg | ORAL_TABLET | ORAL | Status: DC | PRN
  Administered 2024-03-13 – 2024-03-14 (×6): 5 mg via ORAL
  Filled 2024-03-13 (×4): qty 1

## 2024-03-13 MED ORDER — POTASSIUM CHLORIDE CRYS ER 20 MEQ PO TBCR
40.0000 meq | EXTENDED_RELEASE_TABLET | ORAL | Status: AC
  Administered 2024-03-13 (×4): 40 meq via ORAL
  Filled 2024-03-13 (×2): qty 2

## 2024-03-13 MED ORDER — TETANUS-DIPHTH-ACELL PERTUSSIS 5-2.5-18.5 LF-MCG/0.5 IM SUSY
0.5000 mL | PREFILLED_SYRINGE | Freq: Once | INTRAMUSCULAR | Status: AC
  Administered 2024-03-13 (×2): 0.5 mL via INTRAMUSCULAR
  Filled 2024-03-13: qty 0.5

## 2024-03-13 MED ORDER — METHOCARBAMOL 1000 MG/10ML IJ SOLN: 500.0000 mg | Freq: Three times a day (TID) | INTRAMUSCULAR | Status: DC

## 2024-03-13 MED ORDER — ACETAMINOPHEN 500 MG PO TABS
1000.0000 mg | ORAL_TABLET | Freq: Four times a day (QID) | ORAL | Status: DC
  Administered 2024-03-13 – 2024-03-15 (×16): 1000 mg via ORAL
  Filled 2024-03-13 (×9): qty 2

## 2024-03-13 MED ORDER — LORAZEPAM 2 MG/ML IJ SOLN
1.0000 mg | INTRAMUSCULAR | Status: DC | PRN
Start: 2024-03-13 — End: 2024-03-15

## 2024-03-13 MED ORDER — CHLORHEXIDINE GLUCONATE CLOTH 2 % EX PADS
6.0000 | MEDICATED_PAD | Freq: Every day | CUTANEOUS | Status: DC
  Administered 2024-03-14 – 2024-03-15 (×3): 6 via TOPICAL

## 2024-03-13 MED ORDER — NICOTINE 14 MG/24HR TD PT24
14.0000 mg | MEDICATED_PATCH | Freq: Every day | TRANSDERMAL | Status: DC
  Administered 2024-03-13 – 2024-03-15 (×5): 14 mg via TRANSDERMAL
  Filled 2024-03-13 (×3): qty 1

## 2024-03-13 MED ORDER — TETRACAINE HCL 0.5 % OP SOLN
1.0000 [drp] | Freq: Once | OPHTHALMIC | Status: AC
  Administered 2024-03-13 (×2): 1 [drp] via OPHTHALMIC

## 2024-03-13 MED ORDER — OXYCODONE HCL 5 MG PO TABS
10.0000 mg | ORAL_TABLET | ORAL | Status: DC | PRN
  Administered 2024-03-13 – 2024-03-15 (×11): 10 mg via ORAL
  Filled 2024-03-13 (×6): qty 2

## 2024-03-13 MED ORDER — CALCIUM GLUCONATE-NACL 2-0.675 GM/100ML-% IV SOLN
2.0000 g | Freq: Once | INTRAVENOUS | Status: AC
  Administered 2024-03-13 (×2): 2000 mg via INTRAVENOUS
  Filled 2024-03-13 (×2): qty 100

## 2024-03-13 MED ORDER — LEVETIRACETAM 500 MG PO TABS
500.0000 mg | ORAL_TABLET | Freq: Two times a day (BID) | ORAL | Status: DC
  Administered 2024-03-13 – 2024-03-15 (×9): 500 mg via ORAL
  Filled 2024-03-13 (×5): qty 1

## 2024-03-13 MED ORDER — LIDOCAINE HCL (PF) 1 % IJ SOLN
5.0000 mL | Freq: Once | INTRAMUSCULAR | Status: AC
  Administered 2024-03-13 (×2): 5 mL
  Filled 2024-03-13: qty 5

## 2024-03-13 MED ORDER — HYDRALAZINE HCL 20 MG/ML IJ SOLN: 10.0000 mg | INTRAMUSCULAR | Status: DC | PRN
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
## 2024-03-13 NOTE — ED Notes (Signed)
Neurosurgery
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
## 2024-03-13 NOTE — Progress Notes (Signed)
 Orthopedic Tech Progress Note Patient Details:  Bryan Cameron [facility] [birth year] [id number]  Patient ID: Bryan Cameron, male   DOB: [birth year], 33 y.o.   MRN: [id number] I attended trauma page. [name] 03/13/2024, 2:51 AM
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
## 2024-03-13 NOTE — Progress Notes (Signed)
 Pt arrive to [facility]-ICU.  Pt A&Ox4.  Left eye edematous and ecchymotic.  Pt unsure if family aware of admission to hospital.  Pt declined family notification.  Pt educated about [facility] ICU visitation policy and education provided on pt safety.  Pt declined all visitors at this time.
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
## 2024-03-14 ENCOUNTER — Encounter (HOSPITAL_COMMUNITY): Payer: Self-pay

## 2024-03-14 MED ORDER — ORAL CARE MOUTH RINSE: 15.0000 mL | OROMUCOSAL | Status: DC | PRN
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
## 2024-03-15 ENCOUNTER — Encounter (HOSPITAL_COMMUNITY): Payer: Self-pay

## 2024-03-15 ENCOUNTER — Other Ambulatory Visit (HOSPITAL_COMMUNITY): Payer: Self-pay

## 2024-03-15 LAB — MAGNESIUM: Magnesium: 1.9 mg/dL (ref 1.7–2.4)

## 2024-03-15 LAB — BASIC METABOLIC PANEL WITH GFR
Anion gap: 8 (ref 5–15)
BUN: 9 mg/dL (ref 6–20)
CO2: 25 mmol/L (ref 22–32)
Calcium: 8.8 mg/dL — ABNORMAL LOW (ref 8.9–10.3)
Chloride: 102 mmol/L (ref 98–111)
Creatinine, Ser: 0.62 mg/dL (ref 0.61–1.24)
GFR, Estimated: >60 mL/min (ref >60)
Glucose, Bld: 122 mg/dL — ABNORMAL HIGH (ref 70–99)
Potassium: 3.8 mmol/L (ref 3.5–5.1)
Sodium: 135 mmol/L (ref 135–145)

## 2024-03-15 LAB — PHOSPHORUS: Phosphorus: 2.9 mg/dL (ref 2.5–4.6)

## 2024-03-15 MED ORDER — MAGNESIUM SULFATE 2 GM/50ML IV SOLN
2.0000 g | Freq: Once | INTRAVENOUS | Status: AC
  Administered 2024-03-15: 2 g via INTRAVENOUS
  Filled 2024-03-15: qty 50

## 2024-03-15 MED ORDER — OXYCODONE HCL 5 MG PO TABS
5.0000 mg | ORAL_TABLET | ORAL | 0 refills | Status: AC | PRN
  Filled 2024-03-15: qty 10, 2d supply, fill #0

## 2024-03-15 MED ORDER — LEVETIRACETAM 500 MG PO TABS
500.0000 mg | ORAL_TABLET | Freq: Two times a day (BID) | ORAL | 0 refills | Status: AC
  Filled 2024-03-15: qty 10, 5d supply, fill #0

## 2024-03-15 MED ORDER — POTASSIUM CHLORIDE CRYS ER 20 MEQ PO TBCR
40.0000 meq | EXTENDED_RELEASE_TABLET | Freq: Once | ORAL | Status: AC
  Administered 2024-03-15: 40 meq via ORAL
  Filled 2024-03-15: qty 2

## 2024-03-15 MED ORDER — ACETAMINOPHEN 500 MG PO TABS: 1000.0000 mg | ORAL_TABLET | Freq: Four times a day (QID) | ORAL | Status: AC | PRN
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
## 2024-03-15 NOTE — Progress Notes (Signed)
 Pt being d/c.  PIVs removed Discharge information given.  Pt verb understanding and had no further questions  Transport called to wheel chair pt to d/c lounge while he waits for family to come and pick him up.

## 2024-03-15 NOTE — TOC Transition Note (Incomplete)
 Transition of Care [facility]) - Discharge Note   Patient Details  Name: Bryan Cameron MRN: [id number] Date of Birth: 18-Jun-1990  Transition of Care [facility]) CM/SW Contact:  Ruhee Enck M, RN Phone Number: 03/15/2024, 11:40 AM   Clinical Narrative:       Final next level of care: OP Rehab Barriers to Discharge: Barriers Resolved   Patient Goals and CMS Choice            Discharge Placement                       Discharge Plan and Services Additional resources added to the After Visit Summary for     Discharge Planning Services: CM Consult                                 Social Drivers of Health (SDOH) Interventions SDOH Screenings   Tobacco Use: High Risk (03/14/2024)     Readmission Risk Interventions     No data to display

## 2024-03-15 NOTE — Progress Notes (Signed)
 Pt called mother to have him p/u. Sts mother will be here in a bout 2 hours or so.
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
## 2024-03-15 NOTE — Progress Notes (Signed)
 Pharmacy Electrolyte Replacement  Recent Labs:  Recent Labs    03/15/24 0320  K 3.8  MG 1.9  PHOS 2.9  CREATININE 0.62    Low Critical Values (K </= 2.5, Phos </= 1, Mg </= 1) Present: None  MD Contacted: n/a  Plan:  KCl 40 mEq po x1 and Magnesium 2g IV x1.  Recheck in AM.  Harlene Boga, PharmD, BCPS, BCCCP Clinical Pharmacist Please refer to [facility] for [facility] numbers 03/15/2024, 9:05 AM

## 2024-03-21 ENCOUNTER — Other Ambulatory Visit (HOSPITAL_BASED_OUTPATIENT_CLINIC_OR_DEPARTMENT_OTHER): Payer: Self-pay

## 2024-03-24 ENCOUNTER — Other Ambulatory Visit (HOSPITAL_BASED_OUTPATIENT_CLINIC_OR_DEPARTMENT_OTHER): Payer: Self-pay

## 2024-03-24 MED ORDER — AMPHETAMINE-DEXTROAMPHETAMINE 30 MG PO TABS
1.0000 | ORAL_TABLET | Freq: Two times a day (BID) | ORAL | 0 refills | Status: DC
  Filled 2024-03-24: qty 60, 30d supply, fill #0

## 2024-04-26 ENCOUNTER — Other Ambulatory Visit (HOSPITAL_BASED_OUTPATIENT_CLINIC_OR_DEPARTMENT_OTHER): Payer: Self-pay

## 2024-04-27 ENCOUNTER — Other Ambulatory Visit (HOSPITAL_BASED_OUTPATIENT_CLINIC_OR_DEPARTMENT_OTHER): Payer: Self-pay

## 2024-04-27 MED ORDER — AMPHETAMINE-DEXTROAMPHETAMINE 30 MG PO TABS
1.0000 | ORAL_TABLET | Freq: Two times a day (BID) | ORAL | 0 refills | Status: DC
  Filled 2024-04-27: qty 60, 30d supply, fill #0

## 2024-04-28 ENCOUNTER — Other Ambulatory Visit (HOSPITAL_BASED_OUTPATIENT_CLINIC_OR_DEPARTMENT_OTHER): Payer: Self-pay

## 2024-04-28 ENCOUNTER — Encounter (HOSPITAL_BASED_OUTPATIENT_CLINIC_OR_DEPARTMENT_OTHER): Payer: Self-pay

## 2024-04-28 MED ORDER — AMLODIPINE BESYLATE 10 MG PO TABS
10.0000 mg | ORAL_TABLET | Freq: Every day | ORAL | 0 refills | Status: AC
  Filled 2024-04-28: qty 90, 90d supply, fill #0

## 2024-05-24 ENCOUNTER — Other Ambulatory Visit (HOSPITAL_BASED_OUTPATIENT_CLINIC_OR_DEPARTMENT_OTHER): Payer: Self-pay

## 2024-05-24 MED ORDER — AMPHETAMINE-DEXTROAMPHETAMINE 30 MG PO TABS
30.0000 mg | ORAL_TABLET | Freq: Two times a day (BID) | ORAL | 0 refills | Status: DC
  Filled 2024-05-24: qty 60, 30d supply, fill #0

## 2024-06-21 ENCOUNTER — Other Ambulatory Visit (HOSPITAL_BASED_OUTPATIENT_CLINIC_OR_DEPARTMENT_OTHER): Payer: Self-pay

## 2024-06-21 MED ORDER — AMPHETAMINE-DEXTROAMPHETAMINE 30 MG PO TABS
1.0000 | ORAL_TABLET | Freq: Two times a day (BID) | ORAL | 0 refills | Status: DC
  Filled 2024-06-21 (×2): qty 60, 30d supply, fill #0

## 2024-06-26 ENCOUNTER — Other Ambulatory Visit: Payer: Self-pay

## 2024-07-14 ENCOUNTER — Other Ambulatory Visit (HOSPITAL_BASED_OUTPATIENT_CLINIC_OR_DEPARTMENT_OTHER): Payer: Self-pay

## 2024-07-17 ENCOUNTER — Other Ambulatory Visit (HOSPITAL_BASED_OUTPATIENT_CLINIC_OR_DEPARTMENT_OTHER): Payer: Self-pay

## 2024-07-17 MED ORDER — AMPHETAMINE-DEXTROAMPHETAMINE 30 MG PO TABS
1.0000 | ORAL_TABLET | Freq: Two times a day (BID) | ORAL | 0 refills | Status: AC
  Filled 2024-07-19: qty 60, 30d supply, fill #0

## 2024-07-18 ENCOUNTER — Other Ambulatory Visit (HOSPITAL_BASED_OUTPATIENT_CLINIC_OR_DEPARTMENT_OTHER): Payer: Self-pay

## 2024-07-19 ENCOUNTER — Other Ambulatory Visit: Payer: Self-pay

## 2024-07-19 ENCOUNTER — Other Ambulatory Visit (HOSPITAL_BASED_OUTPATIENT_CLINIC_OR_DEPARTMENT_OTHER): Payer: Self-pay
